# Patient Record
Sex: Female | Born: 2000 | Race: Black or African American | Hispanic: No | Marital: Single
Health system: Southern US, Community
[De-identification: ages and names within clinical notes are randomized; demographics above are authoritative.]

## PROBLEM LIST (undated history)

## (undated) DIAGNOSIS — G241 Genetic torsion dystonia: Secondary | ICD-10-CM

## (undated) DIAGNOSIS — R569 Unspecified convulsions: Secondary | ICD-10-CM

## (undated) DIAGNOSIS — R51 Headache: Secondary | ICD-10-CM

## (undated) HISTORY — DX: Headache: R51

## (undated) HISTORY — DX: Unspecified convulsions: R56.9

## (undated) HISTORY — PX: TRACHEOSTOMY: SUR1362

## (undated) HISTORY — DX: Genetic torsion dystonia: G24.1

---
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
## 2021-10-19 DIAGNOSIS — K5900 Constipation, unspecified: Secondary | ICD-10-CM | POA: Insufficient documentation

## 2021-12-21 ENCOUNTER — Other Ambulatory Visit: Payer: Self-pay | Admitting: Internal Medicine

## 2021-12-21 DIAGNOSIS — L02211 Cutaneous abscess of abdominal wall: Secondary | ICD-10-CM

## 2022-01-10 ENCOUNTER — Ambulatory Visit (HOSPITAL_BASED_OUTPATIENT_CLINIC_OR_DEPARTMENT_OTHER)
Admission: RE | Admit: 2022-01-10 | Discharge: 2022-01-10 | Disposition: A | Discharge status: Home / Self Care | Payer: Medicaid Other | Source: Ambulatory Visit | Attending: Internal Medicine | Admitting: Internal Medicine

## 2022-01-10 ENCOUNTER — Other Ambulatory Visit: Payer: Self-pay

## 2022-01-10 DIAGNOSIS — L02211 Cutaneous abscess of abdominal wall: Secondary | ICD-10-CM | POA: Insufficient documentation

## 2022-02-22 ENCOUNTER — Other Ambulatory Visit: Payer: Self-pay | Admitting: Internal Medicine

## 2022-02-22 ENCOUNTER — Other Ambulatory Visit (HOSPITAL_COMMUNITY): Payer: Self-pay | Admitting: Internal Medicine

## 2022-02-22 DIAGNOSIS — Z931 Gastrostomy status: Secondary | ICD-10-CM

## 2022-02-22 DIAGNOSIS — R748 Abnormal levels of other serum enzymes: Secondary | ICD-10-CM | POA: Insufficient documentation

## 2022-02-22 DIAGNOSIS — R7401 Elevation of levels of liver transaminase levels: Secondary | ICD-10-CM

## 2022-03-02 ENCOUNTER — Ambulatory Visit (HOSPITAL_COMMUNITY)
Admission: RE | Admit: 2022-03-02 | Discharge: 2022-03-02 | Disposition: A | Discharge status: Home / Self Care | Payer: Medicaid Other | Source: Ambulatory Visit | Attending: Internal Medicine | Admitting: Internal Medicine

## 2022-03-02 DIAGNOSIS — R748 Abnormal levels of other serum enzymes: Secondary | ICD-10-CM | POA: Diagnosis present

## 2022-03-02 DIAGNOSIS — Z931 Gastrostomy status: Secondary | ICD-10-CM | POA: Diagnosis present

## 2022-03-02 DIAGNOSIS — R7401 Elevation of levels of liver transaminase levels: Secondary | ICD-10-CM | POA: Insufficient documentation

## 2022-04-07 ENCOUNTER — Other Ambulatory Visit (HOSPITAL_COMMUNITY): Payer: Self-pay | Admitting: Internal Medicine

## 2022-04-07 ENCOUNTER — Other Ambulatory Visit: Payer: Self-pay | Admitting: Internal Medicine

## 2022-04-07 DIAGNOSIS — R748 Abnormal levels of other serum enzymes: Secondary | ICD-10-CM

## 2022-04-15 ENCOUNTER — Ambulatory Visit (HOSPITAL_COMMUNITY)
Admission: RE | Admit: 2022-04-15 | Discharge: 2022-04-15 | Disposition: A | Discharge status: Home / Self Care | Payer: Medicaid Other | Source: Ambulatory Visit | Attending: Internal Medicine | Admitting: Internal Medicine

## 2022-04-15 DIAGNOSIS — R748 Abnormal levels of other serum enzymes: Secondary | ICD-10-CM | POA: Diagnosis present

## 2022-11-29 IMAGING — US US ABDOMEN LIMITED
1 series · 13 of 25 positions shown · non-contrast
Comparison: 01/10/2022 abdominal sonogram.

CLINICAL DATA: Elevated liver enzymes.

EXAM:
ULTRASOUND ABDOMEN LIMITED RIGHT UPPER QUADRANT

[Series 1: us abdomen limited ruq (liver/gb) · 13 of 58 slices shown]
[im 1/58]
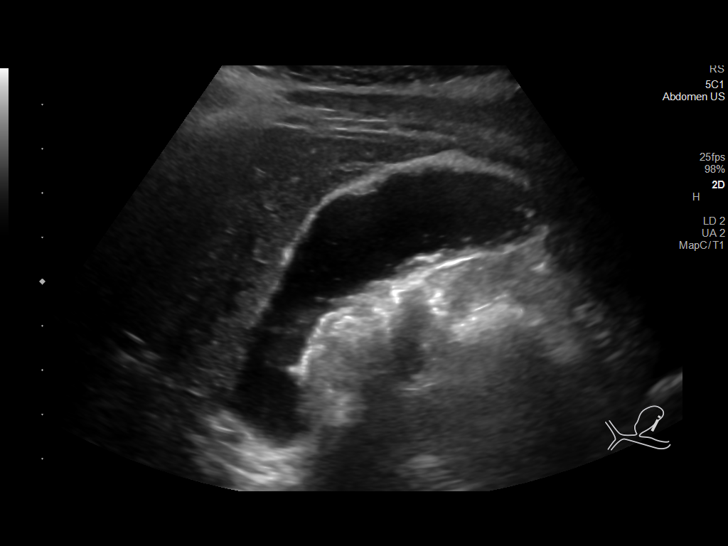
[im 5/58]
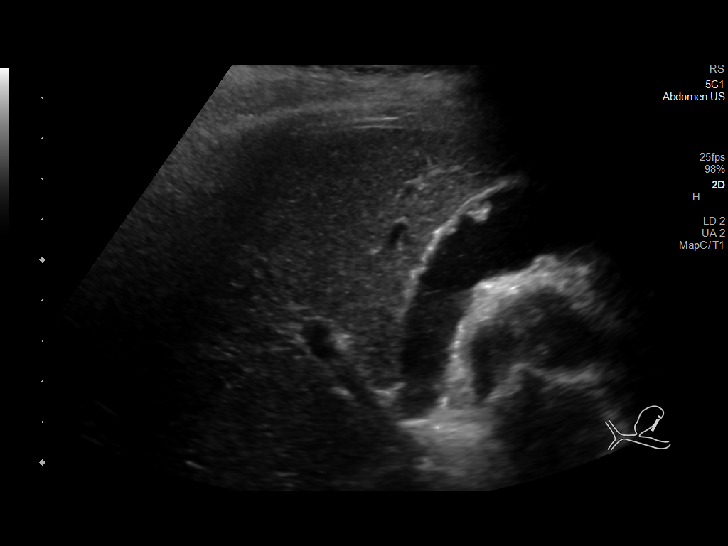
[im 10/58]
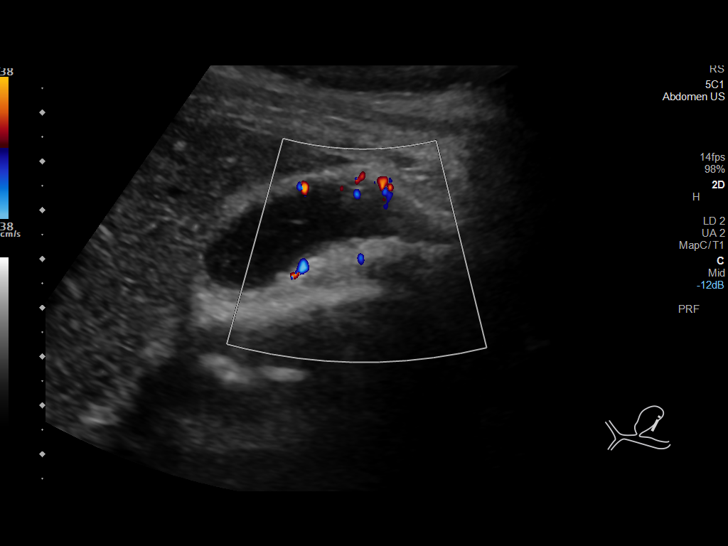
[im 15/58]
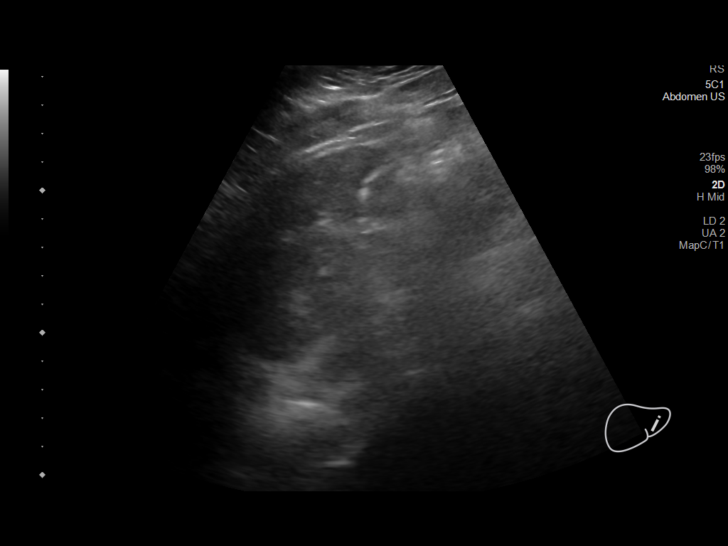
[im 20/58]
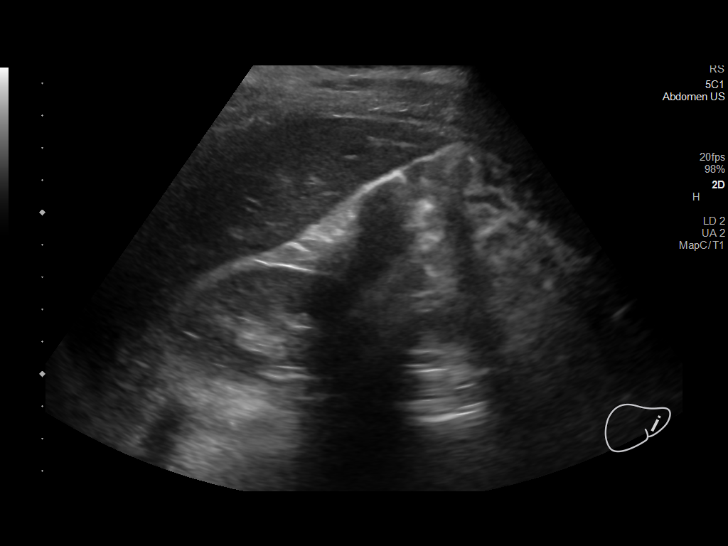
[im 24/58]
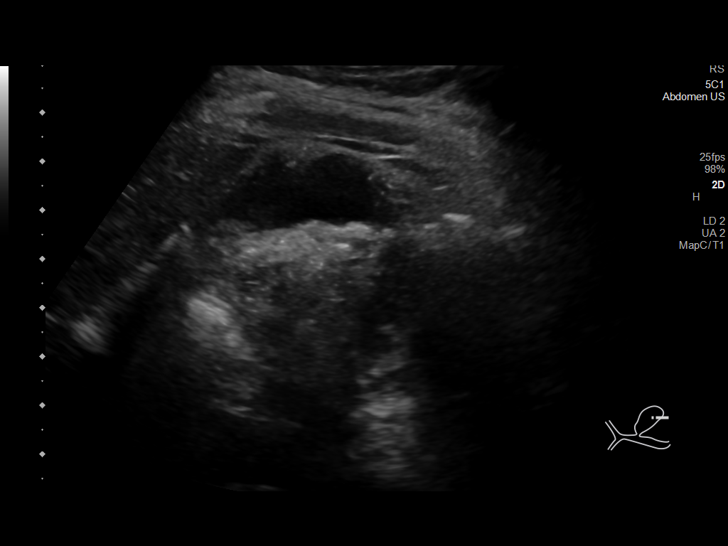
[im 29/58]
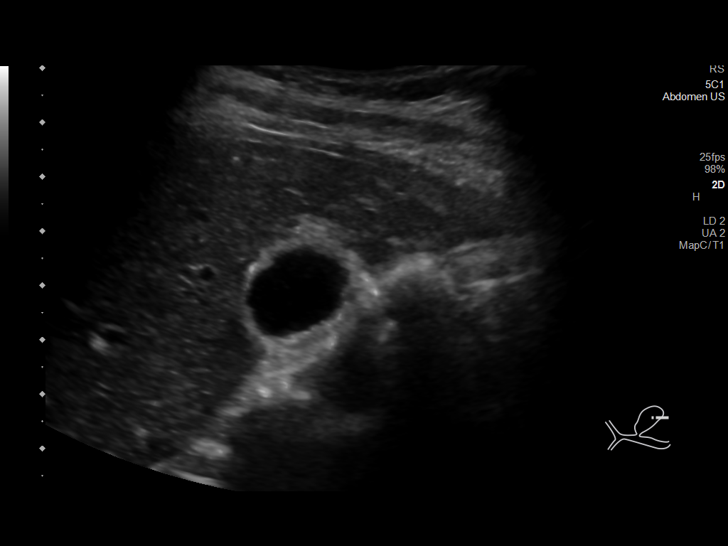
[im 34/58]
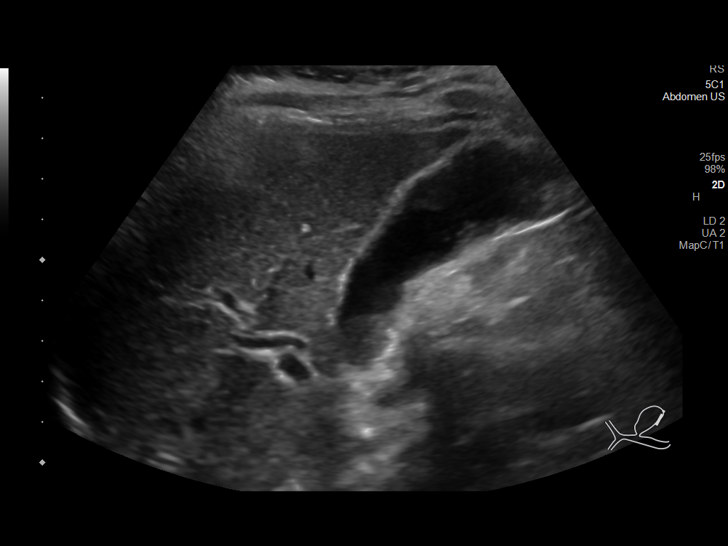
[im 39/58]
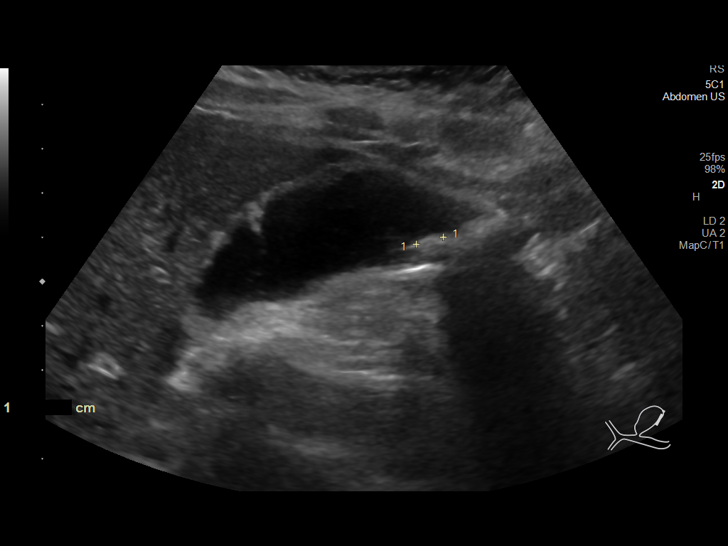
[im 43/58]
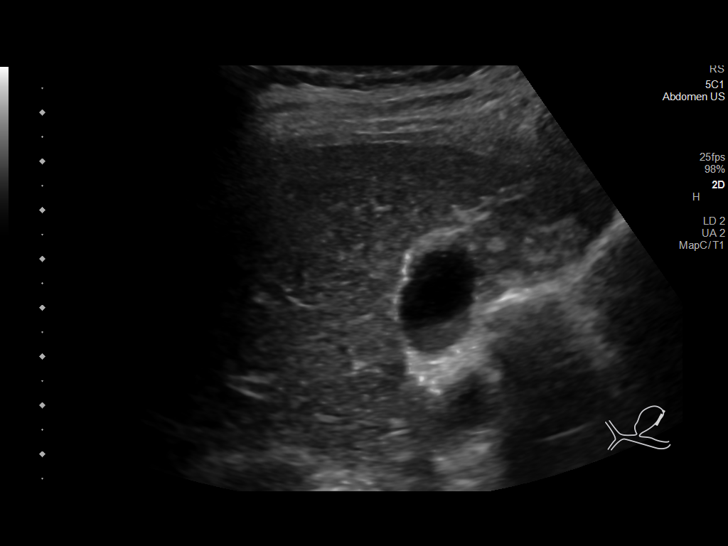
[im 48/58]
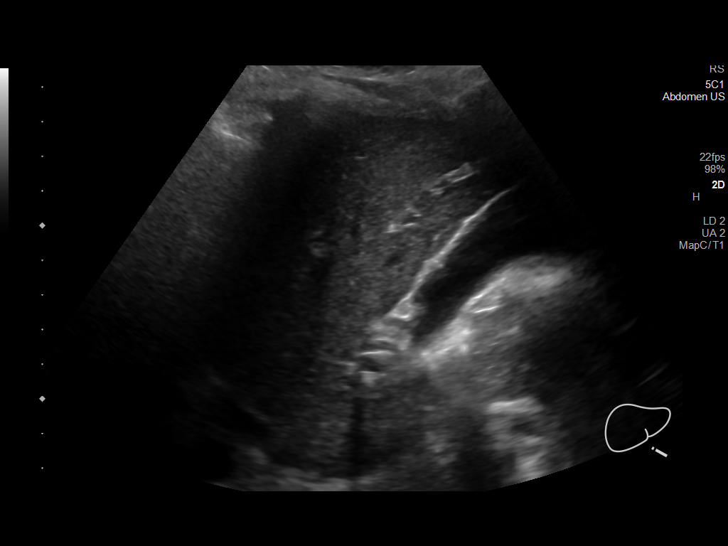
[im 53/58]
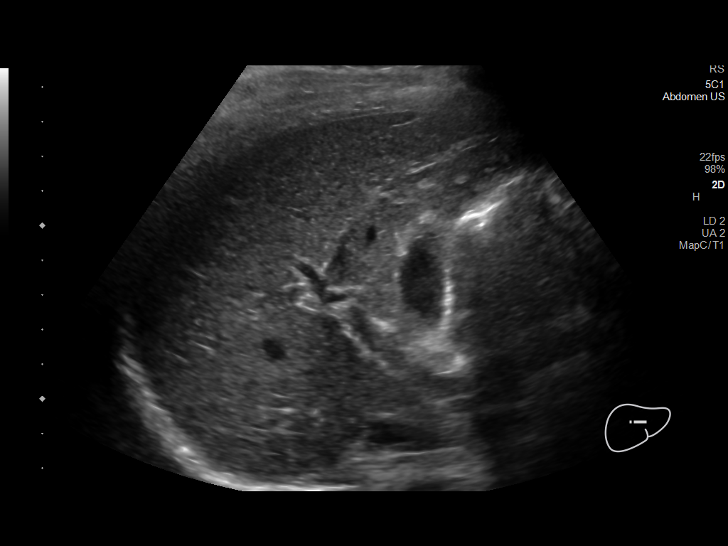
[im 58/58]
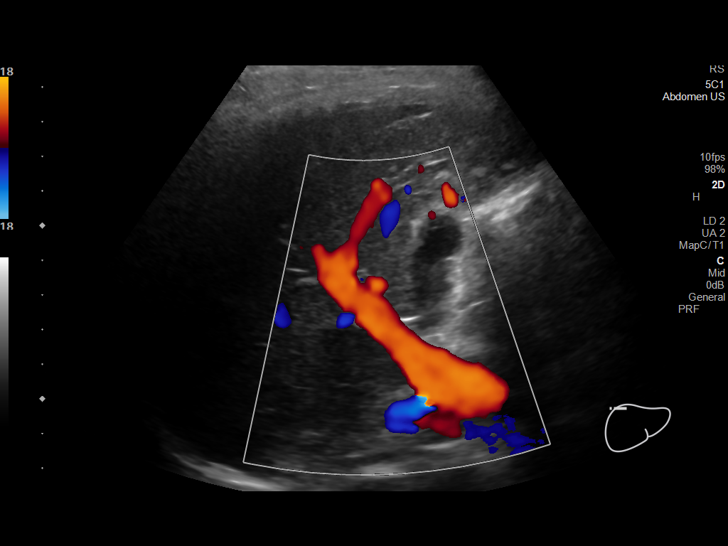

[13 of 25 positions shown; findings below may reference images not displayed]

FINDINGS: Gallbladder:

Scattered foci of ring down artifact in the nondependent gallbladder
wall. Anterior gallbladder wall 4 mm polyp. Posterior fundal
gallbladder wall 6 mm isoechoic round structure with questionable
shadowing (which may represent adjacent bowel gas). No
pericholecystic fluid. No significant gallbladder distention. No
sonographic Murphy sign.

Common bile duct:

Diameter: 3 mm

Liver:

No focal lesion identified. Normal background liver parenchymal
echogenicity. Mildly coarsened liver parenchymal echotexture. Views
of the left liver limited by bowel gas. No definite liver surface
irregularity. Portal vein is patent on color Doppler imaging with
normal direction of blood flow towards the liver.

Other: None.
IMPRESSION: 1. Gallbladder adenomyomatosis. A 6 mm round isoechoic structure in
the fundal gallbladder wall is indeterminate for gallbladder polyp
versus sludge ball versus adherent gallstone. No evidence of acute
cholecystitis. Suggest follow-up right upper quadrant abdominal
sonogram and 6-12 months.
2. No biliary ductal dilatation.
3. Mildly coarsened liver parenchymal echotexture, nonspecific,
cannot exclude hepatic fibrosis. Consider outpatient hepatic
elastography for further liver fibrosis risk stratification, as
clinically warranted.

## 2023-01-12 IMAGING — US US LIVER ELASTOGRAPHY
2 series · 12 of 25 positions shown · non-contrast
Comparison: 03/02/2022

CLINICAL DATA: Elevated LFTs, abnormal RIGHT upper quadrant
ultrasound

EXAM:
US LIVER ELASTOGRAPHY
TECHNIQUE: Sonography of the liver was performed. In addition, ultrasound
elastography evaluation of the liver was performed. A region of
interest was placed within the right lobe of the liver. Following
application of a compressive sonographic pulse, tissue
compressibility was assessed. Multiple assessments were performed at
the selected site. Median tissue compressibility was determined.
Previously, hepatic stiffness was assessed by shear wave velocity.
Based on recently published Society of Radiologists in Ultrasound
consensus article, reporting is now recommended to be performed in
the SI units of pressure (kiloPascals) representing hepatic
stiffness/elasticity. The obtained result is compared to the
published reference standards. (cACLD = compensated Advanced Chronic
Liver Disease)

[Series 1: us elastography liver · 11 of 50 slices shown (1 of 2)]
[im 3/50]
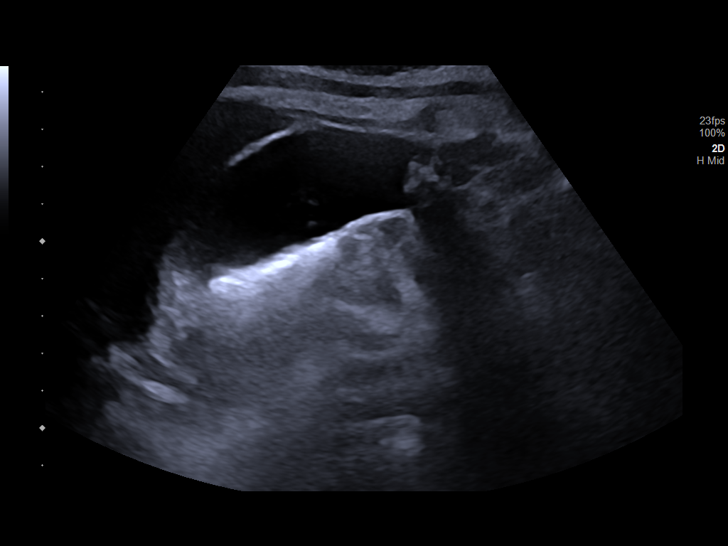
[im 7/50]
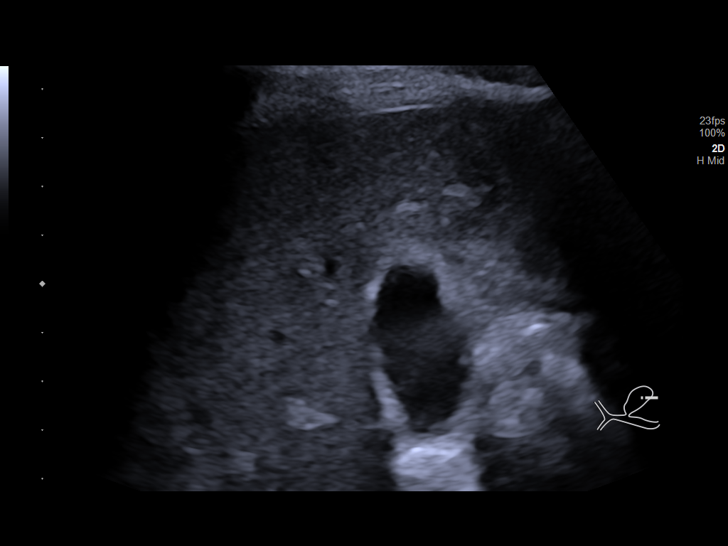
[im 12/50]
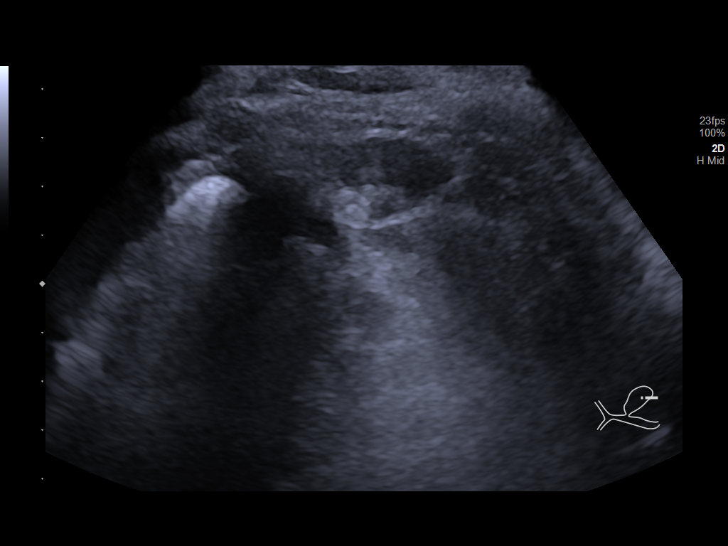
[im 16/50]
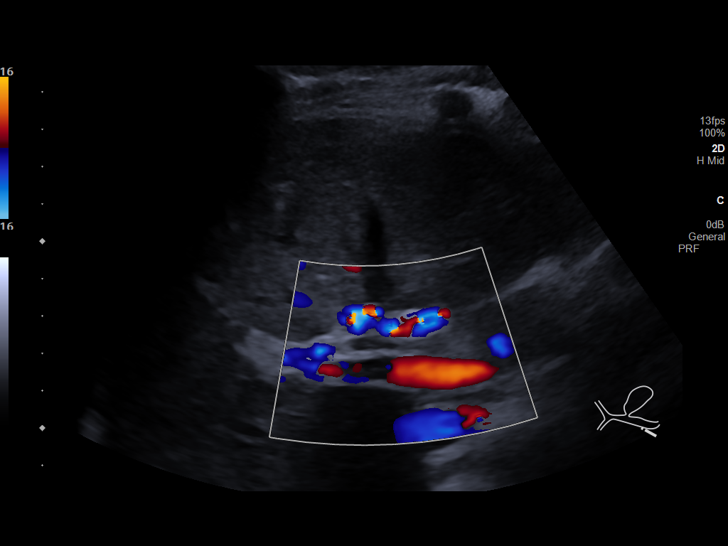
[im 21/50]
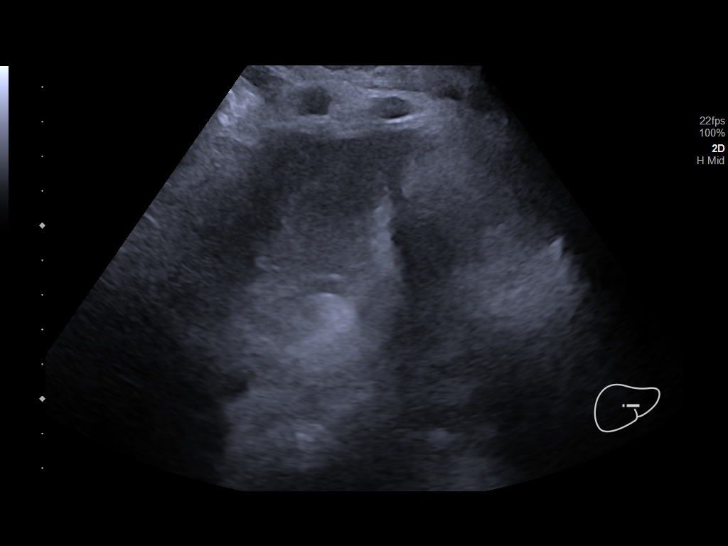
[im 25/50]
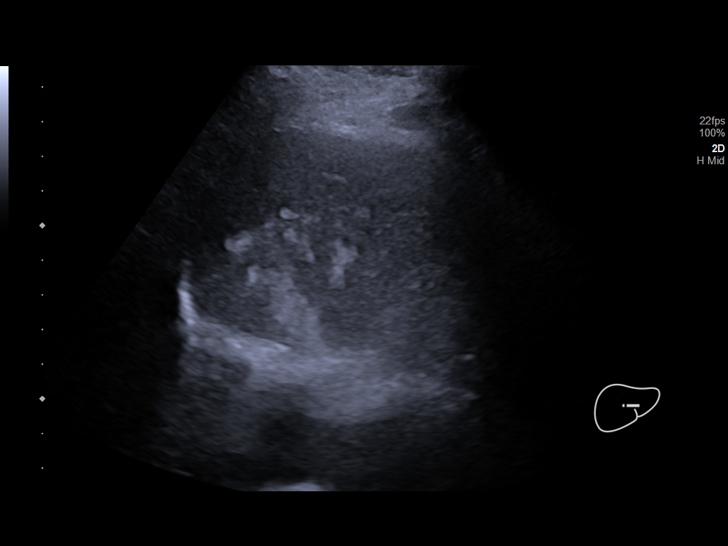
[im 29/50]
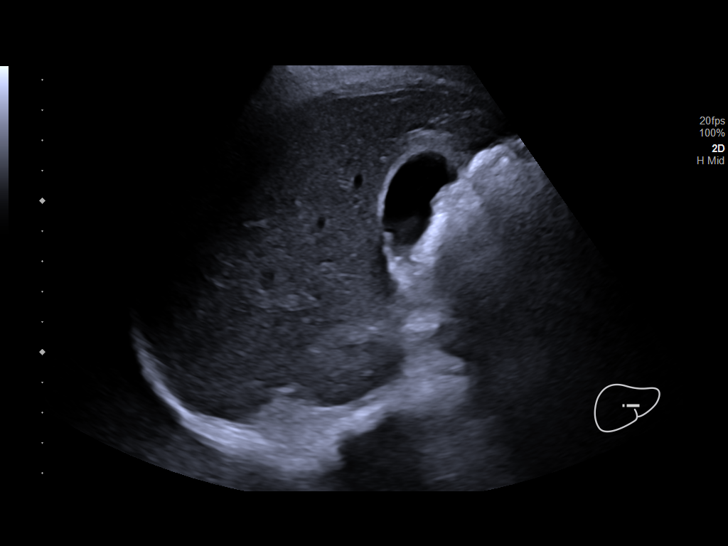
[im 34/50]
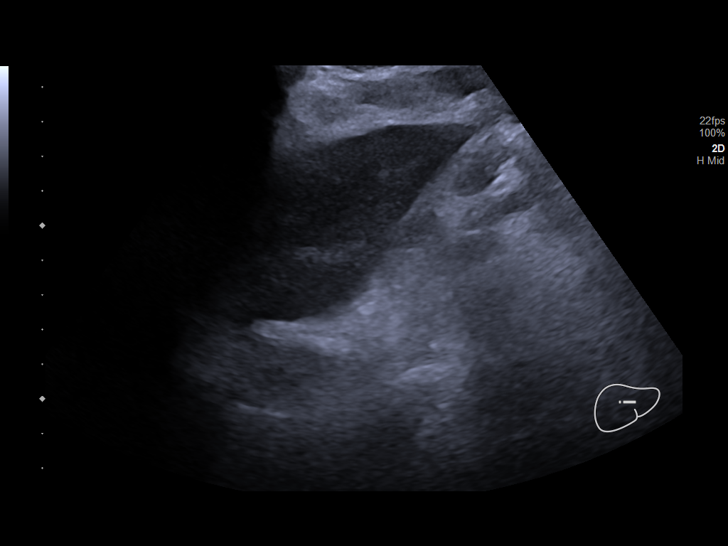
[im 38/50]
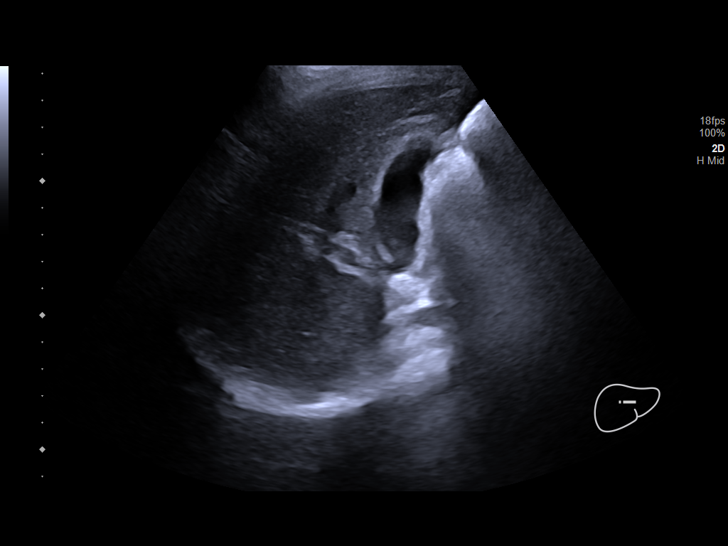
[im 43/50]
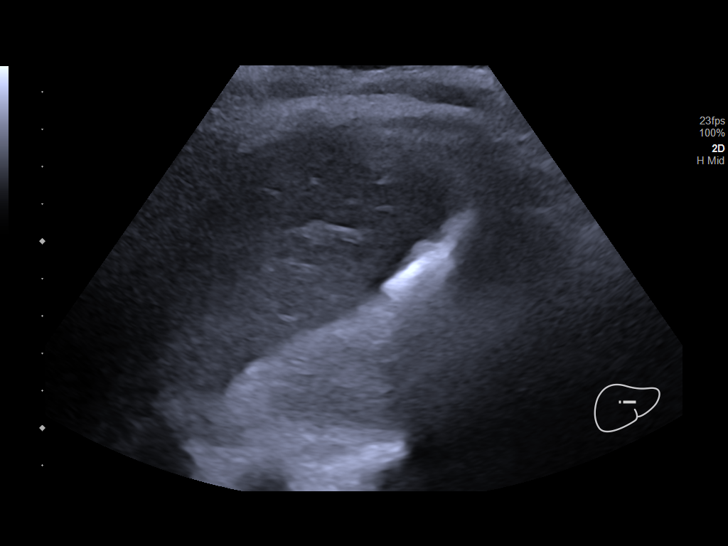
[im 47/50]
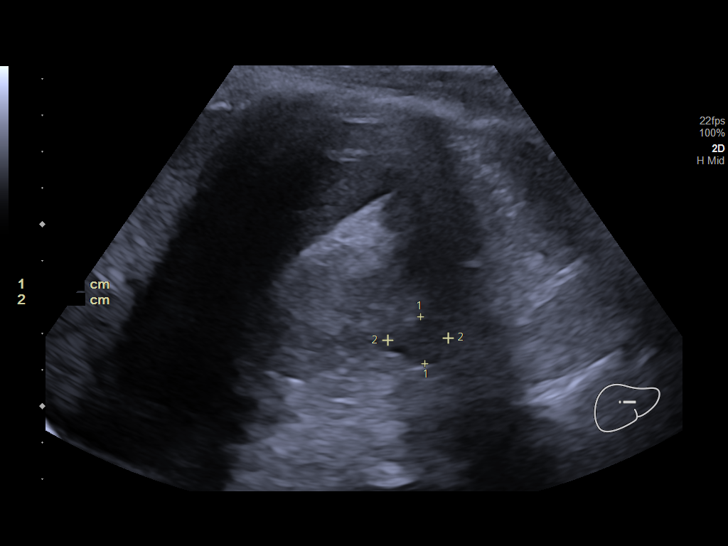

[Series 1001: us elastography liver · 1 of 4 slices shown (2 of 2)]
[im 1/4]
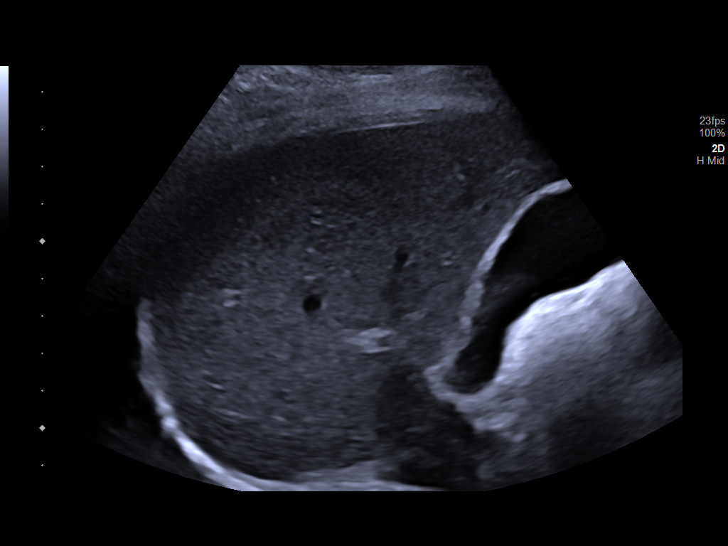

[12 of 25 positions shown; findings below may reference images not displayed]

FINDINGS: Liver: Normal hepatic echogenicity. No definite hepatic mass or
nodularity. Incidentally noted sludge ball within the gallbladder,
without shadowing. Portal vein is patent on color Doppler imaging
with normal direction of blood flow towards the liver.

ULTRASOUND HEPATIC ELASTOGRAPHY

Device: Siemens Helix VTQ

Patient position: Supine

Transducer: [id number]

Number of measurements: 12

Hepatic segment:  8

Median kPa:

IQR:

IQR/Median kPa ratio:

Data quality:  Good

Diagnostic category:  < or = 5 kPa: high probability of being normal

The use of hepatic elastography is applicable to patients with viral
hepatitis and non-alcoholic fatty liver disease. At this time, there
is insufficient data for the referenced cut-off values and use in
other causes of liver disease, including alcoholic liver disease.
Patients, however, may be assessed by elastography and serve as
their own reference standard/baseline.

In patients with non-alcoholic liver disease, the values suggesting
compensated advanced chronic liver disease (cACLD) may be lower, and
patients may need additional testing with elasticity results of [DATE]
kPa.

Please note that abnormal hepatic elasticity and shear wave
velocities may also be identified in clinical settings other than
with hepatic fibrosis, such as: acute hepatitis, elevated right
heart and central venous pressures including use of beta blockers,
Fabech disease (Jassi), infiltrative processes such as
mastocytosis/amyloidosis/infiltrative tumor/lymphoma, extrahepatic
cholestasis, with hyperemia in the post-prandial state, and with
liver transplantation. Correlation with patient history, laboratory
data, and clinical condition recommended.

Diagnostic Categories:

< or =5 kPa: high probability of being normal

< or =9 kPa: in the absence of other known clinical signs, rules [DATE] kPa and ?13 kPa: suggestive of cACLD, but needs further testing

>13 kPa: highly suggestive of cACLD

> or =17 kPa: highly suggestive of cACLD with an increased
probability of clinically significant portal hypertension
IMPRESSION: ULTRASOUND LIVER:

Sludge ball within gallbladder.

No definite hepatic sonographic abnormalities.

ULTRASOUND HEPATIC ELASTOGRAPHY:

Median kPa:

Diagnostic category:  < or = 5 kPa: high probability of being normal

## 2023-03-10 DIAGNOSIS — G319 Degenerative disease of nervous system, unspecified: Secondary | ICD-10-CM | POA: Insufficient documentation

## 2023-03-10 DIAGNOSIS — R627 Adult failure to thrive: Secondary | ICD-10-CM | POA: Insufficient documentation

## 2023-04-26 ENCOUNTER — Encounter (HOSPITAL_COMMUNITY): Payer: Self-pay

## 2023-04-26 DIAGNOSIS — G931 Anoxic brain damage, not elsewhere classified: Secondary | ICD-10-CM | POA: Insufficient documentation

## 2023-05-17 DIAGNOSIS — Z930 Tracheostomy status: Secondary | ICD-10-CM | POA: Insufficient documentation

## 2023-10-31 DIAGNOSIS — S069XAS Unspecified intracranial injury with loss of consciousness status unknown, sequela: Secondary | ICD-10-CM | POA: Insufficient documentation

## 2023-12-28 NOTE — Progress Notes (Signed)
 ------------------------------------------------------------------------------- Attestation signed by Mirna Anette Cohens, DO at 12/28/2023 11:09 AM I have reviewed the resident encounter. I agree with the assessment, plan and medical decision making.   Electronically signed by: Mirna Anette Cohens, DO 12/28/2023 11:09 AM   -------------------------------------------------------------------------------  Otolaryngology Clinic Note  HPI:   Debbie Nichols is a 23 y.o. female with a history of TTP cyclohydrolase 1 deficient DOPA responsive, catatonic disorder, and trach/gtube dependence. Tracheostomy was by [facility] general surgery team 05/03/23 in the setting of prolonged ventilatory support following a cardiac arrest. Per chart review, no trach care information/supplies or follow-up was provided on discharge.   Current Visit: 12/28/23 Patient has been well since last visit. Mom denies any issues with her trach. They have since been able to get all of her trach supplies. Mom suctions trach occasionally throughout the day. Not requiring oxygen.   Initial Visit: 10/26/24 Patient presents today with her mother. Patient non-verbal and bed bound since her cardiac arrest. Mom states that nothing has been done about her trach since discharge - no trach changes and mother had to call and get supplies herself. Other than that, she has not had any issues with the trach. Patient has not required oxygen or a ventilator since her hospitalization. Mom states that they have to suction the trach about once/day. Mom's main goal is to have the trach removed.   PMH/Meds/All/SocHx/FamHx/ROS:   Past Medical History:  Diagnosis Date  . Acute respiratory failure with hypoxia (CMD) 04/27/2023  . Anoxic encephalopathy (CMD) 04/26/2023  . Cerebellar atrophy (CMD) 03/10/2023  . COVID-19 virus infection 11/21/2022  . DOPA responsive dystonia   . 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 Feeding by G-tube (HCC)  10/07/2018  . 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 History of sudden cardiac arrest successfully resuscitated 07/10/2023  . 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 Uses feeding tube     Past Surgical History:  Procedure Laterality Date  . GASTROSTOMY TUBE PLACEMENT     Procedure: GASTROSTOMY TUBE PLACEMENT  . TRACHEOSTOMY TUBE PLACEMENT N/A 05/03/2023   (U8+) TRACHEOSTOMY performed by Lamar Charlton Lunger, MD at [facility] OR   No family history of bleeding disorders, wound healing problems or difficulty with anesthesia.    Current Outpatient Medications:  .  chlorhexidine (PERIDEX) 0.12 % solution, Once Daily., Disp: , Rfl:  .  erythromycin (ROMYCIN) 5 mg/gram (0.5 %) ophthalmic ointment, Apply 0.5 inches to both eyes at bedtime. To eyelid margins, Disp: 3.5 g, Rfl: 11 .  esomeprazole (NexIUM) 20 mg packet, MIX 1 PACKET AND GIVE VIA GTUBE EVERY MORNING, Disp: 90 each, Rfl: 3 .  glycopyrrolate (ROBINUL) 1 mg tablet, Take 1-3 tablets by mouth 3 (three) times a day as needed (for symptoms)., Disp: , Rfl:  .  loratadine (CLARITIN) 10 mg tablet, Take 1 tablet (10 mg total) by mouth daily., Disp: 90 tablet, Rfl: 3 .  magnesium oxide 400 mg (241 mg magnesium) tab, 1 tablet (400 mg total) by G-tube route daily., Disp: 90 tablet, Rfl: 3 .  MISCELLANEOUS MEDICATION/PRODUCT, Suction Machine. As needed for Oral SuctionDX: R13.12, Disp: 1 each, Rfl: 0 .  norethindrone-e.estradioL-iron 1 mg-20 mcg (21)/75 mg (7) tab, 1 tablet by G-tube route daily., Disp: 84 tablet, Rfl: 1 .  polyethylene glycol (MIRALAX) 17 gram powd powder, 17 g by G-tube route daily as needed for constipation., Disp: 578 g, Rfl: 11 .  triamcinolone (KENALOG) 0.1 % ointment, Apply  topically 2 (two)  times a day., Disp: 15 g, Rfl: 1 .  trihexyphenidyL (ARTANE) 2 mg tablet, Take 2 mg by mouth in the morning and 2 mg in the evening. Take with meals., Disp: ,  Rfl:  .  baclofen (LIORESAL) 5 mg split tablet, 15 mg by G-tube route 4 (four) times a day. (Patient not taking: Reported on 12/28/2023), Disp: , Rfl:  .  cephALEXin (KEFLEX) 250 mg/5 mL suspension, 250 mg by G-tube route 4 (four) times a day. (Patient not taking: Reported on 12/28/2023), Disp: , Rfl:  .  humidifiers (Procare Humidifier) misc, 1 Units by miscellaneous route every 8 (eight) hours as needed (Congestion of Dry Mucus of the Trach Material)., Disp: 1 each, Rfl: 0  Please see the patient intake form for full details of past medical history, past surgical history, family history, social history and review of systems.  This information was reviewed.   Physical Exam:    BP 95/62 (BP Location: Right arm, Patient Position: Sitting)   Pulse 91   SpO2 100%   General: Well developed, well nourished.  Non-verbal, doesn't not follow commands   Head/Face: Normocephalic.   Eyes: Eyes open   Ears: Right Auricle: No gross deformity Ear Canal:  Clear of debris, skin is intact and free of erythema and irritation. Tympanic Membrane: Intact without retraction, fluid, or infection. Left Auricle: No gross deformity Ear Canal:  Clear of debris, skin is intact and free of erythema and irritation. Tympanic Membrane: Intact without retraction, fluid, or infection.  Hearing:   Nose: No gross deformity or lesions. No purulent discharge.   Mouth/Oropharynx: Lips without any lesions.   Neck: Trachea midline.  Tracheostomy mature and healthy   Respiratory: No stridor or distress. Breathing comfortably on room air via Shiley [id number]  Cardiovascular: Regular rate, acyanotic.  Extremities:   Skin: No scars or lesions on face or neck, aside those listed above.  Neurologic: Non-verbal, doesn't not follow commands   Other:     Independent Review of Additional Tests or Records:   Notes Reviewed: Hospital records   Procedures:  Procedure Note: Tracheostomy Replacement/Change Date: 10/27/23   PRE-PROCEDURE DIAGNOSIS:  Tracheostomy dependence   PROCEDURE:  Tracheostomy change  FINDINGS:  Mature stoma Modest secretions New [id number] placed without with mild/moderate/significant difficultly  PROCEDURE DETAILS: The patient was positioned supine. The indwelling  trach was removed and replaced with a new [id number]. The inner cannula was inserted, and the trach was secured with a new trach collar. A suction catheter passed easily through the trach tube. The patient tolerated the procedure well and without complications.  Procedure Note  10/27/23  Description of Procedure:  With the patient in the sitting position, topical Afrin was not applied to the nose. The scope was passed through the nose. Examination was carried out of the nose, nasopharynx, oropharynx, hypopharynx, and larynx with findings as noted below. Scope was removed. The patient tolerated the procedure well.   Findings:  - nasal cavity and nasopharynx are unremarkable - tongue base, pharyngeal walls, piriform sinuses, vallecula, epiglottis and postcricoid region are normal in appearance  - vocal folds appear to be mobile and symmetric with coughing (patient unable to follow commands to thoroughly evaluate)  - no lesions on the free edge of the vocal folds nor elsewhere in the larynx worrisome for malignancy - visualized subglottis widely patent, distal trachea widely patent   Retroflexed view looking up in the infraglottic surface of the vocal folds though tracheostomy track    Impression & Plans:  Gari Kimbree Casanas is a 23 y.o. female with a history of TTP cyclohydrolase 1 deficiency, dystonia, catatonic disorder, and trach/gtube dependence. Trach performed by general surgery 04/2023 and no outpatient trach care follow-up arranged. First trach change performed today. Flexible laryngoscopy and tracheoscopy unremarkable today (albeit difficult to fully access vocal fold motion do to neuro status). Mother's primary  hope/goal is decannulation - discussed the reasons for tracheostomy and general criteria for decannulation. She doesn't appear to have an anatomical abnormality that would complicate decannulation.  However, I discussed with mother that my primary concern is her ability to protect her airway and the potential need for pulmonary toilet given her neurological status.  We will plan to see how she does over the next couple months, and if she continues to do well with no ventilatory or frequent suctioning needs, we can consider discussing capping trials at our next trach change.  Mother in agreement with this plan.    Today - no concerns, trach very clean and trach site looks healthy. Mom did not bring a trach in today, so we will change at our next visit.  Electronically signed by: Morene Glendia Montclair, MD 12/28/2023 9:37 AM

## 2024-02-28 DIAGNOSIS — Z7401 Bed confinement status: Secondary | ICD-10-CM | POA: Insufficient documentation

## 2024-02-28 DIAGNOSIS — G8220 Paraplegia, unspecified: Secondary | ICD-10-CM | POA: Insufficient documentation

## 2024-04-25 DIAGNOSIS — I469 Cardiac arrest, cause unspecified: Secondary | ICD-10-CM

## 2024-04-25 HISTORY — DX: Cardiac arrest, cause unspecified: I46.9

## 2024-05-27 ENCOUNTER — Encounter: Payer: Self-pay | Admitting: Physician Assistant

## 2024-07-16 NOTE — Progress Notes (Unsigned)
 07/17/2024 Debbie Nichols [id number] Dec 03, 2000  Referring provider: [facility] Primary GI doctor: Dr. San  ASSESSMENT AND PLAN:  Quadriplegia/anoxic encephalopathy secondary cardiac arrest from aspiration pneumonia June 2024, who is now bedridden, nonverbal, tracheostomy dependent and PEG tube presents for continuing vomiting with tube feedings.  No melena, no anemia Last G tube placed 2020 with peds GI Was getting 280 ml every 5 hours, she is now vomiting this up and stays on stomach, now on 150 ml every 5 hours  Recent ER visit 05/20/2024 for subjective fevers and tachycardia at [facility].  Complained of some leaking around G-tube and CBC c-Met unremarkable lactate negative UA obtain sputum culture.  And suggested potentially following up with interventional radiology to discuss GJ tube to prevent aspiration pneumonia -Likely component of gastroparesis, reglan contraindicated with history of DRD, consider trickle feedings - uncertain if formula needs to be changed as mom states that patient does not have vomiting with bone broth or smoothies - need to find who is managing G tube feedings and refer to dietician for further recommendations, [facility] states they can not see patient.  -consider  increase to 40 mg Nexium -Will benefit from GJ tube to prevent further aspiration or vomiting, will refer to IR for evaluation, if they are unable can refer to general surgery for J tube placement -Follow up with trach clinic - Consider x-ray/CT some decreased bowel sounds lower abdomen query constipation but mother states she does have good bowel movements at home.   History of elevated alk phos 05/20/2024 alk phos 111, AST 18, ALT 27, total bilirubin 0.5 Status post cholecystectomy 10/2022  03/2022 US  elastography K PA 3.6 Negative hepatocellular workup 03/2022 including 24-hour urine copper, PBC Wilsons iron overload viral hepatitis, kidney/liver antibody,  hepatitis autoimmune -Improved off selegline -Continue to monitor  Screening colonoscopy for rectal bleeding 09/22/2022 colonoscopy grade 1 hemorrhoids Suggest no recall colonoscopy   Segawa's Syndrome (dopamine-responsive dystonia) Now with anoxic encephalopathy secondary to cardiac arrest  Over an hour was spent with chart review, explanation to mother (patient advocate), complex decision making.  I think he  Patient Care Team: [facility], [facility] as PCP - General  HISTORY OF PRESENT ILLNESS: 23 y.o. female with a past medical history of seizures and Segawa's Syndrome (dopamine-responsive dystonia) and others listed below presents for evaluation of GJ tube.   Mother provides history, they live in [place]  Previously seen by [facility] 03/06/2023 for elevated liver function.  Persistently elevated alk phos, suggested liver biopsy at that time but I do not see where this was pursued. Serological evaluation negative for chronic viral hepatitis type I autoimmune and iron overload PBC Wilson's including normal 24-hour urine copper. 04/15/2022 US  with elastography GB sludge no advanced fibrosis F equals 3.6K PA.  selegline discontinued 09/2022 and alk phos improving.  Status post cholecystectomy 10/2022 Then saw by [facility] 05/2024.   Discussed the use of AI scribe software for clinical note transcription with the patient, who gave verbal consent to proceed.  History of Present Illness   Debbie Nichols is a 23 year old female with a history of elevated liver enzymes and gastrointestinal issues who presents for evaluation of her digestive system and feeding tube management.  She has a history of elevated liver enzymes, initially noted with inconclusive ultrasound and blood work results. Her liver enzymes were significantly elevated, reaching the 1700s, and were much improved as of June 30th, 2025.  She experiences gastrointestinal issues, including blood in her stools and  constipation, initially attributed to iron supplements for anemia. A colonoscopy revealed internal hemorrhoids. She has difficulty with bowel movements and has been anemic, requiring iron supplementation.  She has a feeding tube (Mickey button) placed, later switched to an adult button. In February, a medication error occurred when medicine was mistakenly injected into the balloon port, causing bleeding and granulation tissue. She uses a cream to manage the site, but there is ongoing concern about the tube's function and her digestive system.  She experiences vomiting and difficulty with her feeding regimen, previously receiving 280 mL every five hours, now reduced to 150 mL due to vomiting. Despite adjustments, she continues to vomit, and her formula seems to remain in her stomach for extended periods. She tolerates bone broth and smoothies better than her formula.  She has a history of cardiac arrest due to aspiration in June 2024, resulting in significant changes in her condition, including reduced mobility. She has a tracheostomy and is scheduled for a follow-up appointment in October. Her mother reports that she is responsive to touch and light, but her overall movement is limited.     She  reports that she is a non-smoker but has been exposed to tobacco smoke. She has never used smokeless tobacco. She reports that she does not drink alcohol and does not use drugs.  RELEVANT GI HISTORY, IMAGING AND LABS: Results   LABS Liver enzymes: 1700 (05/20/2024) CBC: within normal limits (04/2024)  DIAGNOSTIC Colonoscopy: internal hemorrhoids      CBC    Component Value Date/Time   WBC 5.5 01/15/2015 1620   RBC 3.83 01/15/2015 1620   HGB 11.9 01/15/2015 1620   HCT 35.9 01/15/2015 1620   PLT 195 01/15/2015 1620   MCV 94 01/15/2015 1620   MCH 31.1 01/15/2015 1620   MCHC 33.1 01/15/2015 1620   RDW 13.9 01/15/2015 1620   LYMPHSABS 2.1 01/15/2015 1620   EOSABS 0.2 01/15/2015 1620   BASOSABS  0.0 01/15/2015 1620   No results for input(s): HGB in the last 8760 hours.  CMP  No results found for: NA, K, CL, CO2, GLUCOSE, BUN, CREATININE, CALCIUM, PROT, ALBUMIN, AST, ALT, ALKPHOS, BILITOT, GFRNONAA, GFRAA     No data to display            Current Medications:     Current Outpatient Medications (Respiratory):    loratadine (CLARITIN) 10 MG tablet, Take 10 mg by mouth daily.  Current Outpatient Medications (Analgesics):    ibuprofen (ADVIL,MOTRIN) 200 MG tablet, Take 400 mg by mouth every 6 (six) hours as needed.   Current Outpatient Medications (Other):    b complex vitamins tablet, Take 1 tablet by mouth daily.   docusate (COLACE) 50 MG/5ML liquid, Take 50 mg by mouth daily.   erythromycin ophthalmic ointment, Place 1 Application into both eyes at bedtime.   esomeprazole (NEXIUM) 20 MG packet, Take 20 mg by mouth daily before breakfast.   ethosuximide  (ZARONTIN ) 250 MG capsule, TAKE 1 CAPSULE BY MOUTH EVERY NIGHT AT BEDTIME FOR 4 DAYS, 1 CAPSULE BY MOUTH TWICE DAILY FOR 4 DAYS, THEN 2 CAPSULES(500MG ) TWICE DAILY   glycopyrrolate (ROBINUL) 1 MG tablet, Take 1 mg by mouth 2 (two) times daily.   lactulose, encephalopathy, (CHRONULAC) 10 GM/15ML SOLN, Take 10 g by mouth daily as needed.   levETIRAcetam  (KEPPRA ) 750 MG tablet, Take 1 tablet (750 mg total) by mouth 2 (two) times daily.   LORazepam (ATIVAN) 2 MG tablet, Take 2 mg by mouth every 8 (eight) hours as  needed.   carboxymethylcellulose (REFRESH PLUS) 0.5 % SOLN, Place 2 drops into both eyes 3 (three) times daily as needed. (Patient not taking: Reported on 07/17/2024)  Medical History:  Past Medical History:  Diagnosis Date   Cardiac arrest (HCC) 04/25/2024   DOPA responsive dystonia    Headache(784.0)    Allergies: No Known Allergies   Surgical History:  She  has a past surgical history that includes Tracheostomy. Family History:  Her family history includes Anxiety disorder  in her maternal grandmother, mother, and another family member; Depression in her maternal grandmother, mother, and another family member; Schizophrenia in an other family member; Seizures in her father, paternal uncle, and another family member.  REVIEW OF SYSTEMS  : All other systems reviewed and negative except where noted in the History of Present Illness.  PHYSICAL EXAM: BP 104/70 Comment: Per EMS  Ht 4' 9 (1.448 m) Comment: Per patient's mother  Wt 120 lb (54.4 kg) Comment: Per patient's mother  BMI 25.97 kg/m  Physical Exam   GENERAL APPEARANCE: Patient lying on EMS stretcher with upper extremity contractions no apparent distress HEENT: Tracheostomy in place, needing secretions managed during visit  RESPIRATORY: Respiratory effort normal, anterior breath sounds clear CARDIO: Regular rate and rhythm ABDOMEN: Soft, non-distended, active bowel sounds upper abdomen decreased lower abdomen, no obvious tenderness to palpation, per mother patient is able to respond to tactile stimulus RECTAL: declines MUSCULOSKELETAL: Spastic quadriplegia NEURO: Spastic quadriplegia, nonverbal nonresponsive during visit     Alan JONELLE Coombs, PA-C 11:01 AM

## 2024-07-17 ENCOUNTER — Encounter: Payer: Self-pay | Admitting: Physician Assistant

## 2024-07-17 ENCOUNTER — Ambulatory Visit: Admitting: Physician Assistant

## 2024-07-17 VITALS — BP 104/70 | Ht <= 58 in | Wt 120.0 lb

## 2024-07-17 DIAGNOSIS — Z930 Tracheostomy status: Secondary | ICD-10-CM

## 2024-07-17 DIAGNOSIS — K648 Other hemorrhoids: Secondary | ICD-10-CM

## 2024-07-17 DIAGNOSIS — S069XAS Unspecified intracranial injury with loss of consciousness status unknown, sequela: Secondary | ICD-10-CM

## 2024-07-17 DIAGNOSIS — G248 Other dystonia: Secondary | ICD-10-CM | POA: Diagnosis not present

## 2024-07-17 DIAGNOSIS — R748 Abnormal levels of other serum enzymes: Secondary | ICD-10-CM

## 2024-07-17 DIAGNOSIS — G8220 Paraplegia, unspecified: Secondary | ICD-10-CM

## 2024-07-17 DIAGNOSIS — R1110 Vomiting, unspecified: Secondary | ICD-10-CM | POA: Diagnosis not present

## 2024-07-17 DIAGNOSIS — R471 Dysarthria and anarthria: Secondary | ICD-10-CM | POA: Insufficient documentation

## 2024-07-17 DIAGNOSIS — Z7401 Bed confinement status: Secondary | ICD-10-CM

## 2024-07-17 DIAGNOSIS — Z931 Gastrostomy status: Secondary | ICD-10-CM

## 2024-07-17 DIAGNOSIS — G931 Anoxic brain damage, not elsewhere classified: Secondary | ICD-10-CM

## 2024-07-17 DIAGNOSIS — R110 Nausea: Secondary | ICD-10-CM

## 2024-07-17 DIAGNOSIS — R627 Adult failure to thrive: Secondary | ICD-10-CM

## 2024-07-17 NOTE — Progress Notes (Signed)
 Agree with the assessment and plan as outlined by Alan Coombs, PA-C.  Case discussed with Alan while patient was in the office today.  Certainly a difficult clinical situation.  May have a component of gastroparesis, but unfortunately Reglan contraindicated with her history of dopamine responsive dystonia.  Interestingly, patient does not seem to have as much issues tolerating bone broth or smoothies via PEG.  Unclear if this is an issue with the tube feeds themselves.  Would hope that Nutrition could help with us  on that account, but if unable, we will look for alternate resources.  Curious if doing slow feeds throughout the day with 8-hour reprieve at nighttime would allow for better gastric clearance (i.e. trickle feed x 16 hours, 8 hours off).  Agree that another option would be IR potentially converting to GJ tube for direct small bowel feeds.  Could still keep G port in place for venting.  If IR unable, would likely plan for referral to surgery for surgical J-tube, and again still keep G-tube in place for venting purposes.  Depending on response from those 2 services, if unable to accommodate locally, may be better served at one of the larger academic centers for additional capabilities.  I otherwise do not see a role for upper endoscopy at this juncture as this would be largely diagnostic in this high risk individual.  Sandor Flatter, DO, FACG

## 2024-07-17 NOTE — Patient Instructions (Signed)
 We are referring you to interventional radiology and medical nutrition.  They will contact you directly to schedule an appointment.  It may take a week or more before you hear from them.  Please feel free to contact us  if you have not heard from them within 2 weeks and we will follow up on the referral.    VISIT SUMMARY:  Today, we discussed your digestive system and feeding tube management. We reviewed your history of elevated liver enzymes, gastrointestinal issues, and the challenges you face with your feeding tube. We also talked about your vomiting and difficulty with your feeding regimen, as well as your history of cardiac arrest and current mobility limitations.  YOUR PLAN:  -FEEDING TUBE MALFUNCTION WITH PERISTOMAL BLEEDING: Your feeding tube is not working properly, and there is bleeding around the site due to a medication error. We will refer you to interventional radiology to evaluate and possibly place a different type of tube (GJ tube). Continue using the cream to manage the tissue around the tube.  -SUSPECTED GASTROPARESIS CONTRIBUTING TO TUBE FEEDING INTOLERANCE: Gastroparesis is a condition where your stomach empties very slowly, causing vomiting and prolonged retention of your formula. We will refer you to a dietitian to help manage your tube feeding and consider trickle feedings to improve tolerance. We will also refer you to interventional radiology for a potential GJ tube placement to bypass your stomach.  INSTRUCTIONS:  Please follow up with interventional radiology for evaluation and potential GJ tube placement. Also, schedule an appointment with a dietitian to manage your tube feeding. Remember to attend your follow-up appointment with the trach clinic in October.   Thank you for trusting me with your gastrointestinal care!  Alan Coombs, PA-C   _______________________________________________________  If your blood pressure at your visit was 140/90 or greater, please  contact your primary care physician to follow up on this.  _______________________________________________________  If you are age 103 or older, your body mass index should be between 23-30. Your Body mass index is 25.97 kg/m. If this is out of the aforementioned range listed, please consider follow up with your Primary Care Provider.  If you are age 2 or younger, your body mass index should be between 19-25. Your Body mass index is 25.97 kg/m. If this is out of the aformentioned range listed, please consider follow up with your Primary Care Provider.   ________________________________________________________  The [facility] providers would like to encourage you to use MYCHART to communicate with providers for non-urgent requests or questions.  Due to long hold times on the telephone, sending your provider a message by [facility] may be a faster and more efficient way to get a response.  Please allow 48 business hours for a response.  Please remember that this is for non-urgent requests.  _______________________________________________________  [facility] is using a team-based approach to care.  Your team is made up of your doctor and two to three APPS. Our APPS (Nurse Practitioners and Physician Assistants) work with your physician to ensure care continuity for you. They are fully qualified to address your health concerns and develop a treatment plan. They communicate directly with your gastroenterologist to care for you. Seeing the Advanced Practice Practitioners on your physician's team can help you by facilitating care more promptly, often allowing for earlier appointments, access to diagnostic testing, procedures, and other specialty referrals.

## 2024-08-30 ENCOUNTER — Other Ambulatory Visit (HOSPITAL_COMMUNITY): Payer: Self-pay | Admitting: Physician Assistant

## 2024-08-30 DIAGNOSIS — R1110 Vomiting, unspecified: Secondary | ICD-10-CM

## 2024-09-06 ENCOUNTER — Other Ambulatory Visit (HOSPITAL_COMMUNITY): Payer: Self-pay | Admitting: Physician Assistant

## 2024-09-06 ENCOUNTER — Ambulatory Visit (HOSPITAL_COMMUNITY)
Admission: RE | Admit: 2024-09-06 | Discharge: 2024-09-06 | Disposition: A | Source: Ambulatory Visit | Attending: Physician Assistant | Admitting: Physician Assistant

## 2024-09-06 DIAGNOSIS — R1110 Vomiting, unspecified: Secondary | ICD-10-CM | POA: Insufficient documentation

## 2024-09-06 DIAGNOSIS — Z431 Encounter for attention to gastrostomy: Secondary | ICD-10-CM | POA: Insufficient documentation

## 2024-09-06 HISTORY — PX: IR REPLC GASTRO/COLONIC TUBE PERCUT W/FLUORO: IMG2333

## 2024-09-06 MED ORDER — IOHEXOL 300 MG/ML  SOLN
50.0000 mL | Freq: Once | INTRAMUSCULAR | Status: AC | PRN
  Administered 2024-09-06: 15 mL

## 2024-09-06 MED ORDER — LIDOCAINE VISCOUS HCL 2 % MT SOLN
OROMUCOSAL | Status: AC
  Filled 2024-09-06: qty 15

## 2024-09-12 NOTE — Progress Notes (Signed)
 ------------------------------------------------------------------------------- Attestation signed by Mirna Anette Cohens, DO at 09/13/2024  8:07 AM I have reviewed the resident encounter. I agree with the assessment, plan and medical decision making.   Electronically signed by: Mirna Anette Cohens, DO 09/13/2024 8:07 AM   -------------------------------------------------------------------------------  Otolaryngology Clinic Note  HPI:   Debbie Nichols is a 23 y.o. female with a history of TTP cyclohydrolase 1 deficient DOPA responsive, catatonic disorder, and trach/gtube dependence. Tracheostomy was by [facility] general surgery team 05/03/23 in the setting of prolonged ventilatory support following a cardiac arrest. Per chart review, no trach care information/supplies or follow-up was provided on discharge.   Today's Visit 09/12/2024 - Patient was admitted for PNA in June during prior planned appt. Has not had trach change since April. Mom noticed increased malodorous secretions over past two months. Requires suctioning several times daily, occasionally has consistency of tube feeds. No oxygen or vent requirements.   Previous Visit: 02/28/24 Pt hospitalized for ~3 wks in Feb/Mar for pneumonia per mom. No issues with trach, however. No oxygen or vent requirements.    Previous Visit: 12/28/23 Patient has been well since last visit. Mom denies any issues with her trach. They have since been able to get all of her trach supplies. Mom suctions trach occasionally throughout the day. Not requiring oxygen.   Initial Visit: 10/26/24 Patient presents today with her mother. Patient non-verbal and bed bound since her cardiac arrest. Mom states that nothing has been done about her trach since discharge - no trach changes and mother had to call and get supplies herself. Other than that, she has not had any issues with the trach. Patient has not required oxygen or a ventilator since her hospitalization.  Mom states that they have to suction the trach about once/day. Mom's main goal is to have the trach removed.   PMH/Meds/All/SocHx/FamHx/ROS:   Past Medical History:  Diagnosis Date  . Acute respiratory failure with hypoxia    (CMD) 04/27/2023  . Anoxic encephalopathy    (CMD) 04/26/2023  . 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 COVID-19 virus infection 11/21/2022  . DOPA responsive dystonia   . 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 Feeding by G-tube (HCC) 10/07/2018  . Gastrostomy tube dependent    (CMD) 05/17/2023  . 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 History of sudden cardiac arrest successfully resuscitated 07/10/2023  . Pneumonia   . 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 Uses feeding tube     Past Surgical History:  Procedure Laterality Date  . GASTROSTOMY TUBE PLACEMENT     Procedure: GASTROSTOMY TUBE PLACEMENT  . TRACHEOSTOMY TUBE PLACEMENT N/A 05/03/2023   (U8+) TRACHEOSTOMY performed by Lamar Charlton Lunger, MD at [facility] OR   No family history of bleeding disorders, wound healing problems or difficulty with anesthesia.    Current Outpatient Medications:  .  baclofen (LIORESAL) 5 mg split tablet, 15 mg by G-tube route 4 (four) times a day., Disp: , Rfl:  .  carboxymethylcellulose (REFRESH TEARS) 0.5 % drop ophthalmic solution, Administer 1-2 drops into affected eye(s) every hour as needed., Disp: , Rfl:  .  cephALEXin (KEFLEX) 250 mg/5 mL suspension, 250 mg by G-tube route 4 (four) times a day., Disp: , Rfl:  .  chlorhexidine (PERIDEX) 0.12 % solution, Once Daily., Disp: , Rfl:  .  erythromycin (ROMYCIN) 5 mg/gram (0.5 %) ophthalmic ointment, Apply 0.5 inches to both eyes at bedtime. To eyelid margins, Disp: 3.5 g, Rfl: 11 .  esomeprazole (NexIUM) 20  mg packet, MIX 1 PACKET AND GIVE VIA GTUBE EVERY MORNING, Disp: 90 each, Rfl: 3 .  glycopyrrolate (ROBINUL) 1 mg tablet, Take 1-3 tablets by mouth 3 (three) times a day as needed (for  symptoms)., Disp: , Rfl:  .  humidifiers (Procare Humidifier) misc, 1 Units by miscellaneous route every 8 (eight) hours as needed (Congestion of Dry Mucus of the Trach Material)., Disp: 1 each, Rfl: 0 .  loratadine (CLARITIN) 10 mg tablet, Take 1 tablet (10 mg total) by mouth daily., Disp: 90 tablet, Rfl: 3 .  magnesium oxide 400 mg (241 mg magnesium) tab, 1 tablet (400 mg total) by G-tube route daily., Disp: 90 tablet, Rfl: 3 .  MISCELLANEOUS MEDICATION/PRODUCT, Suction Machine. As needed for Oral SuctionDX: R13.12, Disp: 1 each, Rfl: 0 .  norethindrone-e.estradioL-iron (Blisovi Fe 1/20, 28,) 1 mg-20 mcg (21)/75 mg (7) tab, TAKE 1 TABLET VIA GTUBE DAILY, Disp: , Rfl:  .  norethindrone-e.estradioL-iron 1 mg-20 mcg (21)/75 mg (7) tab, 1 tablet by G-tube route daily., Disp: 84 tablet, Rfl: 1 .  polyethylene glycol (MIRALAX) 17 gram powd powder, 17 g by G-tube route daily as needed for constipation., Disp: 578 g, Rfl: 11 .  triamcinolone (KENALOG) 0.1 % ointment, Apply  topically 2 (two) times a day., Disp: 15 g, Rfl: 1  Please see the patient intake form for full details of past medical history, past surgical history, family history, social history and review of systems.  This information was reviewed.   Physical Exam:    BP (!) 84/64 Comment: family stated this is her normal BP.  Pulse 101   Resp 16   Ht 1.575 m (5' 2)   Wt 52.2 kg (115 lb)   SpO2 99%   BMI 21.03 kg/m   General: Well developed, well nourished.  Non-verbal, doesn't not follow commands   Head/Face: Normocephalic.   Eyes: Eyes open   Ears: No gross deformity  Hearing:   Nose: No gross deformity or lesions. No purulent discharge.   Mouth/Oropharynx: Lips without any lesions.   Neck: Trachea midline.  Tracheostomy mature and healthy; small tuft of granulation tissue at 7 o'clock   Respiratory: No stridor or distress. Breathing comfortably on room air via Shiley [id number]  Cardiovascular: Regular rate, acyanotic.   Extremities:   Skin: No scars or lesions on face or neck, aside those listed above.  Neurologic: Non-verbal, doesn't not follow commands   Other:     Independent Review of Additional Tests or Records:   Notes Reviewed: Hospital records   Procedures:   Trach Change  Date/Time: 09/12/2024 8:30 AM  Performed by: Vinie Lamar Grimmer, MD Authorized by: Mirna Anette Cohens, DO     Procedure Note: Tracheostomy Change  Patient/MRN: Debbie Nichols [id number]  PRE-PROCEDURE DIAGNOSIS:  Tracheostomy dependence   PROCEDURE:  Tracheostomy change  FINDINGS:  Mature stoma, mild infrastomal granulation Minimal secretions 6UN placed without difficultly  PROCEDURE DETAILS: The patient was positioned supine. The velcro trach ties were released.  The indwelling 6UN trach was removed and replaced with a 6UN.  A new velcro trach tie was secured in place and confirmed to be snug. The patient tolerated the procedure well and without complications.   Procedure Note  Flexible laryngoscopy and tracheoscopy  10/27/23  Findings:  - nasal cavity and nasopharynx are unremarkable - tongue base, pharyngeal walls, piriform sinuses, vallecula, epiglottis and postcricoid region are normal in appearance  - vocal folds appear to be mobile and symmetric with coughing (patient unable to follow  commands to thoroughly evaluate)  - no lesions on the free edge of the vocal folds nor elsewhere in the larynx worrisome for malignancy - visualized subglottis widely patent, distal trachea widely patent   Retroflexed view looking up in the infraglottic surface of the vocal folds though tracheostomy track    Impression & Plans:  Debbie Nichols is a 23 y.o. female with a history of TTP cyclohydrolase 1 deficiency, dystonia, catatonic disorder, and trach/gtube dependence. Trach performed by general surgery 04/2023 and no outpatient trach care follow-up arranged. Trach change performed today.  Previous flexible laryngoscopy and tracheoscopy unremarkable (albeit difficult to fully access vocal fold motion do to neuro status). Mother's primary hope/goal remains decannulation - discussed the reasons for tracheostomy and general criteria for decannulation. She doesn't appear to have an anatomical abnormality that would complicate decannulation.  However, I discussed with mother that my primary concern is her ability to protect her airway and the potential need for pulmonary toilet given her neurological status.  We will plan to see how she does over the winter, and if she continues to do well with no ventilatory or frequent suctioning needs, we can consider discussing capping trials this spring.  Mother in agreement with this plan.  Given increase in malodorous secretions, tracheal culture taken.   F/u for routine trach change   Electronically signed by: Vinie Lamar Grimmer, MD 09/12/2024 9:03 AM

## 2024-10-07 ENCOUNTER — Other Ambulatory Visit (HOSPITAL_COMMUNITY): Payer: Self-pay | Admitting: Student

## 2024-10-07 DIAGNOSIS — R627 Adult failure to thrive: Secondary | ICD-10-CM

## 2024-10-07 DIAGNOSIS — Z931 Gastrostomy status: Secondary | ICD-10-CM

## 2024-10-14 ENCOUNTER — Ambulatory Visit (HOSPITAL_COMMUNITY)
Admission: RE | Admit: 2024-10-14 | Discharge: 2024-10-14 | Disposition: A | Discharge status: Home / Self Care | Source: Ambulatory Visit | Attending: Student | Admitting: Student

## 2024-10-14 DIAGNOSIS — Z931 Gastrostomy status: Secondary | ICD-10-CM

## 2024-10-14 DIAGNOSIS — R627 Adult failure to thrive: Secondary | ICD-10-CM

## 2024-10-14 HISTORY — PX: IR RADIOLOGIST EVAL & MGMT: IMG5224

## 2024-10-14 NOTE — Progress Notes (Signed)
 Patient ID: Debbie Nichols, female   DOB: 06/17/2001, 23 y.o.   MRN: [id number]    Referring Physician(s): Alvia Solmon Sara  Supervising Physician: Johann Sieving  Patient Status:  [facility] - Outpatient  Chief Complaint:  Granular tissue and bleeding around button gtube site  Subjective:  Pt with reported small amount of bleeding and increase of granular tissue around button gtube site. Family states this has been worsening over the last several weeks. Per family, she previously had silver nitrate applied to the site April this year which seemed to help and here today for reapplication to control bleeding and granular tissue. Family otherwise states tube has been functioning well, no other concerns.  Allergies: Patient has no known allergies.  Medications: Prior to Admission medications   Medication Sig Start Date End Date Taking? Authorizing Provider  b complex vitamins tablet Take 1 tablet by mouth daily.    [provider]  carboxymethylcellulose (REFRESH PLUS) 0.5 % SOLN Place 2 drops into both eyes 3 (three) times daily as needed. Patient not taking: Reported on 07/17/2024    [provider]  docusate (COLACE) 50 MG/5ML liquid Take 50 mg by mouth daily. 03/22/24   [provider]  erythromycin ophthalmic ointment Place 1 Application into both eyes at bedtime. 06/05/23   [provider]  esomeprazole (NEXIUM) 20 MG packet Take 20 mg by mouth daily before breakfast. 06/22/24   [provider]  ethosuximide  (ZARONTIN ) 250 MG capsule TAKE 1 CAPSULE BY MOUTH EVERY NIGHT AT BEDTIME FOR 4 DAYS, 1 CAPSULE BY MOUTH TWICE DAILY FOR 4 DAYS, THEN 2 CAPSULES(500MG ) TWICE DAILY 07/23/15   Marianna City, NP  glycopyrrolate (ROBINUL) 1 MG tablet Take 1 mg by mouth 2 (two) times daily. 04/27/22   [provider]  ibuprofen (ADVIL,MOTRIN) 200 MG tablet Take 400 mg by mouth every 6 (six) hours as needed.    [provider]  lactulose,  encephalopathy, (CHRONULAC) 10 GM/15ML SOLN Take 10 g by mouth daily as needed. 03/21/24   [provider]  levETIRAcetam  (KEPPRA ) 750 MG tablet Take 1 tablet (750 mg total) by mouth 2 (two) times daily. 06/15/15   Corinthia Blossom, MD  loratadine (CLARITIN) 10 MG tablet Take 10 mg by mouth daily. 11/25/22   [provider]  LORazepam (ATIVAN) 2 MG tablet Take 2 mg by mouth every 8 (eight) hours as needed. 12/30/22   [provider]     Vital Signs: There were no vitals taken for this visit.  Physical Exam Vitals and nursing note reviewed.  Constitutional:      Comments: Not alert - at basline. Resting in EMS cot in IR bay  Pulmonary:     Effort: Pulmonary effort is normal.  Abdominal:     Palpations: Abdomen is soft.     Comments: + button g tube. Granular tissue growth circumferentially with most prominence on inferior aspect of gtube insertion. Small pinpoint superficial bleeding noted at the [facility] and 3 oclock positions. No erythema, warmth, leaking, or other abnormality.  Skin:    General: Skin is warm and dry.  Neurological:     Mental Status: She is oriented to person, place, and time. Mental status is at baseline.     Imaging: No results found.  Labs:  CBC: No results for input(s): WBC, HGB, HCT, PLT in the last 8760 hours.  COAGS: No results for input(s): INR, APTT in the last 8760 hours.  BMP: No results for input(s): NA, K, CL, CO2, GLUCOSE, BUN, CALCIUM,  CREATININE, GFRNONAA, GFRAA in the last 8760 hours.  Invalid input(s): CMP  LIVER FUNCTION TESTS: No results for input(s): BILITOT, AST, ALT, ALKPHOS, PROT, ALBUMIN in the last 8760 hours.  Assessment and Plan:  Bleeding/granular tissue growth around gtube site - small amount of superficial bleeding with granular tissue noted circumferentially around button gtube site - silver nitrate applied today with cessation of bleeding and granular  tissue completely coated - site otherwise appears unremarkable, no concern for infection, displacement of tube, or other problem - encouraged family to follow symptoms, continue care they are doing and call back with any future questions or concerns.  Electronically Signed: Kimble VEAR Clas, PA-C 10/14/2024, 9:50 AM   I spent a total of 15 Minutes at the the patient's bedside AND on the patient's hospital floor or unit, greater than 50% of which was counseling/coordinating care for gtube site treatment with silver nitrate

## 2024-10-15 ENCOUNTER — Encounter (HOSPITAL_BASED_OUTPATIENT_CLINIC_OR_DEPARTMENT_OTHER): Attending: Internal Medicine | Admitting: Internal Medicine

## 2024-10-15 DIAGNOSIS — G931 Anoxic brain damage, not elsewhere classified: Secondary | ICD-10-CM | POA: Insufficient documentation

## 2024-10-15 DIAGNOSIS — F061 Catatonic disorder due to known physiological condition: Secondary | ICD-10-CM | POA: Diagnosis not present

## 2024-10-15 DIAGNOSIS — L89323 Pressure ulcer of left buttock, stage 3: Secondary | ICD-10-CM | POA: Insufficient documentation

## 2024-10-15 DIAGNOSIS — G8220 Paraplegia, unspecified: Secondary | ICD-10-CM | POA: Insufficient documentation

## 2024-10-15 DIAGNOSIS — G40909 Epilepsy, unspecified, not intractable, without status epilepticus: Secondary | ICD-10-CM | POA: Diagnosis not present

## 2024-11-18 ENCOUNTER — Encounter (HOSPITAL_BASED_OUTPATIENT_CLINIC_OR_DEPARTMENT_OTHER): Attending: Internal Medicine | Admitting: Internal Medicine

## 2024-11-18 DIAGNOSIS — L89323 Pressure ulcer of left buttock, stage 3: Secondary | ICD-10-CM | POA: Insufficient documentation

## 2024-11-18 DIAGNOSIS — Z8674 Personal history of sudden cardiac arrest: Secondary | ICD-10-CM | POA: Insufficient documentation

## 2024-11-18 DIAGNOSIS — G931 Anoxic brain damage, not elsewhere classified: Secondary | ICD-10-CM | POA: Insufficient documentation

## 2024-11-18 DIAGNOSIS — F061 Catatonic disorder due to known physiological condition: Secondary | ICD-10-CM | POA: Diagnosis not present

## 2024-11-18 DIAGNOSIS — G8220 Paraplegia, unspecified: Secondary | ICD-10-CM | POA: Diagnosis not present

## 2024-11-18 DIAGNOSIS — G40909 Epilepsy, unspecified, not intractable, without status epilepticus: Secondary | ICD-10-CM | POA: Insufficient documentation

## 2024-12-17 ENCOUNTER — Encounter (HOSPITAL_BASED_OUTPATIENT_CLINIC_OR_DEPARTMENT_OTHER): Admitting: Internal Medicine

## 2024-12-30 ENCOUNTER — Ambulatory Visit: Admitting: Physical Therapy

## 2025-01-06 ENCOUNTER — Encounter (HOSPITAL_BASED_OUTPATIENT_CLINIC_OR_DEPARTMENT_OTHER): Admitting: Internal Medicine
